# Patient Record
Sex: Female | Born: 2005 | Race: Black or African American | Hispanic: No | Marital: Single | Smoking: Never smoker
Health system: Southern US, Community
[De-identification: ages and names within clinical notes are randomized; demographics above are authoritative.]

## PROBLEM LIST (undated history)

## (undated) DIAGNOSIS — R4586 Emotional lability: Secondary | ICD-10-CM

## (undated) DIAGNOSIS — R41840 Attention and concentration deficit: Secondary | ICD-10-CM

## (undated) DIAGNOSIS — Z720 Tobacco use: Secondary | ICD-10-CM

## (undated) HISTORY — DX: Attention and concentration deficit: R41.840

## (undated) HISTORY — DX: Tobacco use: Z72.0

## (undated) HISTORY — DX: Emotional lability: R45.86

---
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
## 2024-01-03 DIAGNOSIS — F332 Major depressive disorder, recurrent severe without psychotic features: Secondary | ICD-10-CM | POA: Diagnosis not present

## 2024-01-03 DIAGNOSIS — F411 Generalized anxiety disorder: Secondary | ICD-10-CM | POA: Diagnosis not present

## 2024-03-31 DIAGNOSIS — F332 Major depressive disorder, recurrent severe without psychotic features: Secondary | ICD-10-CM | POA: Diagnosis not present

## 2024-03-31 DIAGNOSIS — F411 Generalized anxiety disorder: Secondary | ICD-10-CM | POA: Diagnosis not present

## 2024-07-28 ENCOUNTER — Other Ambulatory Visit (HOSPITAL_COMMUNITY): Payer: Self-pay

## 2024-09-29 ENCOUNTER — Other Ambulatory Visit: Payer: Self-pay

## 2024-09-29 ENCOUNTER — Emergency Department (HOSPITAL_COMMUNITY)
Admission: EM | Admit: 2024-09-29 | Discharge: 2024-09-29 | Disposition: A | Discharge status: Home / Self Care | Attending: Emergency Medicine | Admitting: Emergency Medicine

## 2024-09-29 ENCOUNTER — Encounter (HOSPITAL_COMMUNITY): Payer: Self-pay

## 2024-09-29 ENCOUNTER — Emergency Department (HOSPITAL_COMMUNITY)

## 2024-09-29 DIAGNOSIS — J069 Acute upper respiratory infection, unspecified: Secondary | ICD-10-CM | POA: Insufficient documentation

## 2024-09-29 DIAGNOSIS — Y990 Civilian activity done for income or pay: Secondary | ICD-10-CM | POA: Insufficient documentation

## 2024-09-29 DIAGNOSIS — S6702XA Crushing injury of left thumb, initial encounter: Secondary | ICD-10-CM | POA: Diagnosis not present

## 2024-09-29 DIAGNOSIS — S6992XA Unspecified injury of left wrist, hand and finger(s), initial encounter: Secondary | ICD-10-CM | POA: Diagnosis not present

## 2024-09-29 DIAGNOSIS — W230XXA Caught, crushed, jammed, or pinched between moving objects, initial encounter: Secondary | ICD-10-CM | POA: Insufficient documentation

## 2024-09-29 MED ORDER — ACETAMINOPHEN 325 MG PO TABS
650.0000 mg | ORAL_TABLET | Freq: Once | ORAL | Status: AC
  Administered 2024-09-29: 650 mg via ORAL
  Filled 2024-09-29: qty 2

## 2024-09-29 MED ORDER — BENZONATATE 100 MG PO CAPS: 100.0000 mg | ORAL_CAPSULE | Freq: Three times a day (TID) | ORAL | 0 refills | Status: AC

## 2024-09-29 NOTE — Discharge Instructions (Addendum)
 As we discussed, your workup in the ER today was reassuring for acute findings.  X-ray imaging of your thumb did not reveal any fracture or dislocation.  I recommend that you take Tylenol /ibuprofen  as needed for pain and follow-up with the hand specialist I have referred you to as needed if you are still having discomfort at the next few days.  Also given you a prescription for Tessalon which is a cough suppressant medication you can take as prescribed as needed for management of your cough.   I recommend that you get plenty of rest and focus on symptomatic relief which includes Cepacol throat lozenges for sore throat, Mucinex D (orange box) which you can get from behind the counter at your local pharmacy for congestion, and tylenol /ibuprofen  as needed for fevers and bodyaches. I also recommend:  Increased fluid intake. Sports drinks offer valuable electrolytes, sugars, and fluids.  Breathing heated mist or steam (vaporizer or shower).  Eating chicken soup or other clear broths, and maintaining good nutrition.   Increasing usage of your inhaler if you have asthma.  Return to work when your temperature has returned to normal.  Gargle warm salt water and spit it out for sore throat. Take benadryl or Zyrtec  to decrease sinus secretions.  Follow Up: Follow up with your primary care doctor in 5-7 days for recheck of ongoing symptoms.  Return to emergency department for emergent changing or worsening of symptoms.

## 2024-09-29 NOTE — ED Triage Notes (Signed)
 C/o sore throat onset 2-3 days ago , at work states she mashed her left thumb  Sat

## 2024-09-29 NOTE — ED Provider Notes (Signed)
 [place] EMERGENCY DEPARTMENT AT [facility] Provider Note   CSN: [id number] Arrival date & time: 09/29/24  1032     Patient presents with: Sore Throat   Alyssa Mcfarland is a 18 y.o. female.   Patient with noncontributory past medical history presents today with complaints of left thumb injury. Reports that same occurred on Saturday when she mashed her thumb in a gate. Reports pain to same. Also reports cough and congestion for 2-3 days. Reports coworker sick with similar symptoms.  Endorses sore throat as well, however is tolerating secretions and denies any voice changes.  No fevers or chills.  No chest pain or shortness of breath.  Has been taking Tylenol  with some improvement.  Does not want to be seen or evaluated for her cold, she clarifies that she just wants her thumb x-ray, however she would like a prescription for cold medicine.  The history is provided by the patient. No language interpreter was used.  Sore Throat       Prior to Admission medications   Medication Sig Start Date End Date Taking? Authorizing Provider  buPROPion  (WELLBUTRIN  XL) 150 MG 24 hr tablet Take 1 tablet (150 mg total) by mouth daily. 06/12/22   Jonnalagadda, Janardhana, MD  cyclobenzaprine  (FLEXERIL ) 5 MG tablet Take 1 tablet by mouth before bed as needed for muscle spasm. Warning: May cause drowsiness. 09/05/23   Rolinda Rogue, MD  hydrOXYzine  (ATARAX ) 25 MG tablet Take 1 tablet (25 mg total) by mouth at bedtime as needed and may repeat dose one time if needed for anxiety. 06/11/22   Jonnalagadda, Janardhana, MD  ibuprofen  (ADVIL ) 600 MG tablet Take 1 tablet (600 mg total) by mouth every 6 (six) hours as needed. 09/05/23   Rolinda Rogue, MD  loratadine  (CLARITIN ) 10 MG tablet Take 1 tablet (10 mg total) by mouth at bedtime. Patient not taking: Reported on 09/05/2023 06/11/22   Jonnalagadda, Janardhana, MD  Multiple Vitamin (MULTIVITAMIN ADULT PO) Take 1 tablet by mouth daily. Patient not taking:  Reported on 09/05/2023    [provider]  triamcinolone  (KENALOG ) 0.025 % ointment Apply 1 Application topically 2 (two) times daily. 12/14/23   Delores Suzann HERO, MD    Allergies: Patient has no known allergies.    Review of Systems  HENT:  Positive for congestion.   Respiratory:  Positive for cough.   Musculoskeletal:  Positive for arthralgias.  All other systems reviewed and are negative.   Updated Vital Signs BP 113/68   Pulse 63   Temp (!) 97.5 F (36.4 C)   Resp 16   Ht 5' 6 (1.676 m)   Wt 68.9 kg   LMP 09/27/2024 (Approximate)   SpO2 100%   BMI 24.53 kg/m   Physical Exam Vitals and nursing note reviewed.  Constitutional:      General: She is not in acute distress.    Appearance: Normal appearance. She is normal weight. She is not ill-appearing, toxic-appearing or diaphoretic.  HENT:     Head: Normocephalic and atraumatic.     Mouth/Throat:     Mouth: Mucous membranes are moist.     Pharynx: Uvula midline. No pharyngeal swelling, posterior oropharyngeal erythema or uvula swelling.     Tonsils: No tonsillar exudate or tonsillar abscesses.  Cardiovascular:     Rate and Rhythm: Normal rate and regular rhythm.     Heart sounds: Normal heart sounds.  Pulmonary:     Effort: Pulmonary effort is normal. No respiratory distress.  Breath sounds: Normal breath sounds.  Musculoskeletal:        General: Normal range of motion.     Cervical back: Normal range of motion.     Comments: TTP noted to the MCP of the left thumb. No significant swelling or deformity. Good capillary refill. Able to touch all fingers and extend her thumb without issue.  ROM intact to the left wrist, no snuffbox tenderness  Skin:    General: Skin is warm and dry.  Neurological:     General: No focal deficit present.     Mental Status: She is alert.  Psychiatric:        Mood and Affect: Mood normal.        Behavior: Behavior normal.     (all labs ordered are listed, but only abnormal  results are displayed) Labs Reviewed - No data to display  EKG: None  Radiology: DG Finger Thumb Left Result Date: 09/29/2024 CLINICAL DATA:  Left thumb injury. EXAM: LEFT THUMB 2+V COMPARISON:  None Available. FINDINGS: There is no evidence of fracture or dislocation. There is no evidence of arthropathy or other focal bone abnormality. Soft tissues are unremarkable. IMPRESSION: Negative. Electronically Signed   By: Juliene Balder M.D.   On: 09/29/2024 13:54     Procedures   Medications Ordered in the ED - No data to display                                  Medical Decision Making Amount and/or Complexity of Data Reviewed Radiology: ordered.   Patient presents today with complaints of cough, congestion, left thumb pain.  She is afebrile, nontoxic-appearing, and in no acute distress reassuring vital signs.  Physical exam reveals uvula midline, tolerating secretions, normal phonation, no signs of PTA or RPA.  Lung sounds CTA in all fields, no indication for further evaluation with CXR at this time.  Offered COVID, flu, RSV, and strep swabs which patient declines. No clinical signs or symptoms of strep. Reports that she really only wants her left thumb evaluated. Exam shows TTP noted to the MCP of the left thumb. No significant swelling or deformity. Good capillary refill. Able to touch all fingers and extend her thumb without issue.  ROM intact to the left wrist, no snuffbox tenderness.  X-ray imaging ordered and obtained of the left thumb which has resulted and reveals no acute findings.  I have personally reviewed and interpreted this imaging and agree with radiology interpretation.  Discussed findings with patient, recommend RICE and Tylenol /ibuprofen  for pain.  Hand referral given for follow-up as needed.  Also provide Tessalon  for cough per her request.  Evaluation and diagnostic testing in the emergency department does not suggest an emergent condition requiring admission or  immediate intervention beyond what has been performed at this time.  Plan for discharge with close PCP follow-up.  Patient is understanding and amenable with plan, educated on red flag symptoms that would prompt immediate return.  Patient discharged in stable condition.  Final diagnoses:  Viral URI with cough  Crushing injury of left thumb, initial encounter    ED Discharge Orders          Ordered    benzonatate  (TESSALON ) 100 MG capsule  Every 8 hours        09/29/24 1458          An After Visit Summary was printed and given to the patient.  Edge Mauger A, PA-C 09/29/24 1500    Tegeler, Lonni PARAS, MD 09/29/24 [phone]

## 2024-09-30 ENCOUNTER — Ambulatory Visit (HOSPITAL_COMMUNITY)
